# Patient Record
Sex: Female | Born: 1975 | Race: White | Hispanic: No | Marital: Married | Smoking: Never smoker
Health system: Southern US, Community
[De-identification: ages and names within clinical notes are randomized; demographics above are authoritative.]

---

## 2014-10-05 ENCOUNTER — Other Ambulatory Visit: Payer: Self-pay | Admitting: Obstetrics and Gynecology

## 2014-10-05 DIAGNOSIS — R928 Other abnormal and inconclusive findings on diagnostic imaging of breast: Secondary | ICD-10-CM

## 2014-10-12 ENCOUNTER — Ambulatory Visit
Admission: RE | Admit: 2014-10-12 | Discharge: 2014-10-12 | Disposition: A | Discharge status: Home / Self Care | Payer: BC Managed Care – PPO | Source: Ambulatory Visit | Attending: Obstetrics and Gynecology | Admitting: Obstetrics and Gynecology

## 2014-10-12 DIAGNOSIS — R928 Other abnormal and inconclusive findings on diagnostic imaging of breast: Secondary | ICD-10-CM

## 2016-03-17 ENCOUNTER — Encounter (HOSPITAL_COMMUNITY): Payer: Self-pay | Admitting: Emergency Medicine

## 2016-03-17 ENCOUNTER — Ambulatory Visit (HOSPITAL_COMMUNITY)
Admission: EM | Admit: 2016-03-17 | Discharge: 2016-03-17 | Disposition: A | Discharge status: Home / Self Care | Payer: BC Managed Care – PPO | Attending: Family Medicine | Admitting: Family Medicine

## 2016-03-17 DIAGNOSIS — R05 Cough: Secondary | ICD-10-CM

## 2016-03-17 DIAGNOSIS — J40 Bronchitis, not specified as acute or chronic: Secondary | ICD-10-CM

## 2016-03-17 DIAGNOSIS — R059 Cough, unspecified: Secondary | ICD-10-CM

## 2016-03-17 MED ORDER — METHYLPREDNISOLONE 4 MG PO TBPK: ORAL_TABLET | ORAL | 0 refills | Status: AC

## 2016-03-17 MED ORDER — AZITHROMYCIN 250 MG PO TABS: 250.0000 mg | ORAL_TABLET | Freq: Every day | ORAL | 0 refills | Status: AC

## 2016-03-17 NOTE — ED Triage Notes (Signed)
The patient presented to the [facility] with a complaint of a cough and chest congestion x 3 weeks.

## 2016-03-17 NOTE — ED Provider Notes (Signed)
CSN: [id number]     Arrival date & time 03/17/16  1637 History   None    Chief Complaint  Patient presents with  . Cough   (Consider location/radiation/quality/duration/timing/severity/associated sxs/prior Treatment) Patient c/o cough for 3 weeks.   The history is provided by the patient.  Cough  Cough characteristics:  Non-productive Sputum characteristics:  Nondescript Severity:  Moderate Onset quality:  Sudden Duration:  3 weeks Timing:  Constant Progression:  Unchanged Chronicity:  New Smoker: no   Relieved by:  Nothing Worsened by:  Nothing Ineffective treatments:  None tried   History reviewed. No pertinent past medical history. History reviewed. No pertinent surgical history. History reviewed. No pertinent family history. Social History  Substance Use Topics  . Smoking status: Never Smoker  . Smokeless tobacco: Never Used  . Alcohol use Yes     Comment: Occassional   OB History    No data available     Review of Systems  Constitutional: Negative.   HENT: Negative.   Eyes: Negative.   Respiratory: Positive for cough.   Cardiovascular: Negative.   Gastrointestinal: Negative.   Endocrine: Negative.   Genitourinary: Negative.   Musculoskeletal: Negative.   Skin: Negative.   Allergic/Immunologic: Negative.   Neurological: Negative.   Hematological: Negative.   Psychiatric/Behavioral: Negative.     Allergies  Review of patient's allergies indicates no known allergies.  Home Medications   Prior to Admission medications   Medication Sig Start Date End Date Taking? Authorizing Provider  azithromycin (ZITHROMAX) 250 MG tablet Take 1 tablet (250 mg total) by mouth daily. Take first 2 tablets together, then 1 every day until finished. 03/17/16   Deatra CanterWilliam J Jaegar Croft, FNP  methylPREDNISolone (MEDROL DOSEPAK) 4 MG TBPK tablet Take 6-5-4-3-2-1 po qd 03/17/16   Deatra CanterWilliam J Kaari Zeigler, FNP   Meds Ordered and Administered this Visit  Medications - No data to  display  BP 124/69 (BP Location: Left Arm)   Pulse 65   Temp 98.5 F (36.9 C) (Oral)   Resp 18   SpO2 98%  No data found.   Physical Exam  Constitutional: She appears well-developed and well-nourished.  HENT:  Head: Normocephalic and atraumatic.  Right Ear: External ear normal.  Left Ear: External ear normal.  Mouth/Throat: Oropharynx is clear and moist.  Eyes: Conjunctivae and EOM are normal. Pupils are equal, round, and reactive to light.  Neck: Normal range of motion. Neck supple.  Cardiovascular: Normal rate, regular rhythm and normal heart sounds.   Pulmonary/Chest: Effort normal. She has wheezes.  Abdominal: Soft. Bowel sounds are normal.  Nursing note and vitals reviewed.   Urgent Care Course   Clinical Course    Procedures (including critical care time)  Labs Review Labs Reviewed - No data to display  Imaging Review No results found.   Visual Acuity Review  Right Eye Distance:   Left Eye Distance:   Bilateral Distance:    Right Eye Near:   Left Eye Near:    Bilateral Near:         MDM   1. Bronchitis   2. Cough    zpak as directed Medrol dose pack as directed 4mg  #21 Continue otc cough and cold medicine.    Deatra CanterWilliam J Wei Poplaski, FNP 03/17/16 1742

## 2016-09-25 IMAGING — MG MM DIAG BREAST TOMO UNI LEFT
4 series · 4 of 12 positions shown · non-contrast
Comparison: Prior exams

CLINICAL DATA: Call back from screening for a possible left breast
mass.

EXAM:
DIGITAL DIAGNOSTIC LEFT MAMMOGRAM WITH 3D TOMOSYNTHESIS AND CAD

[L CC]
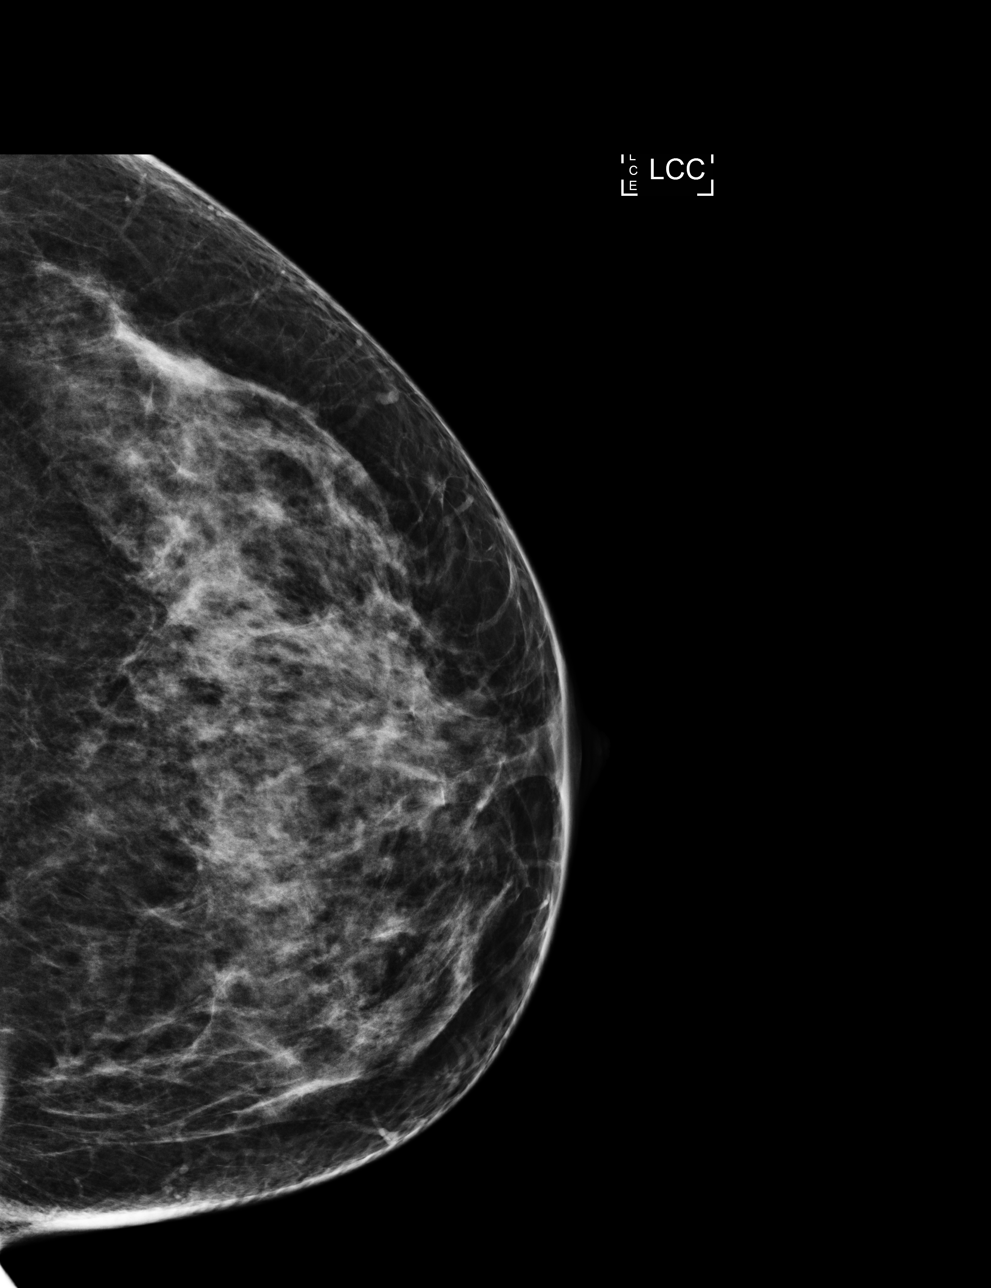

[L MLO]
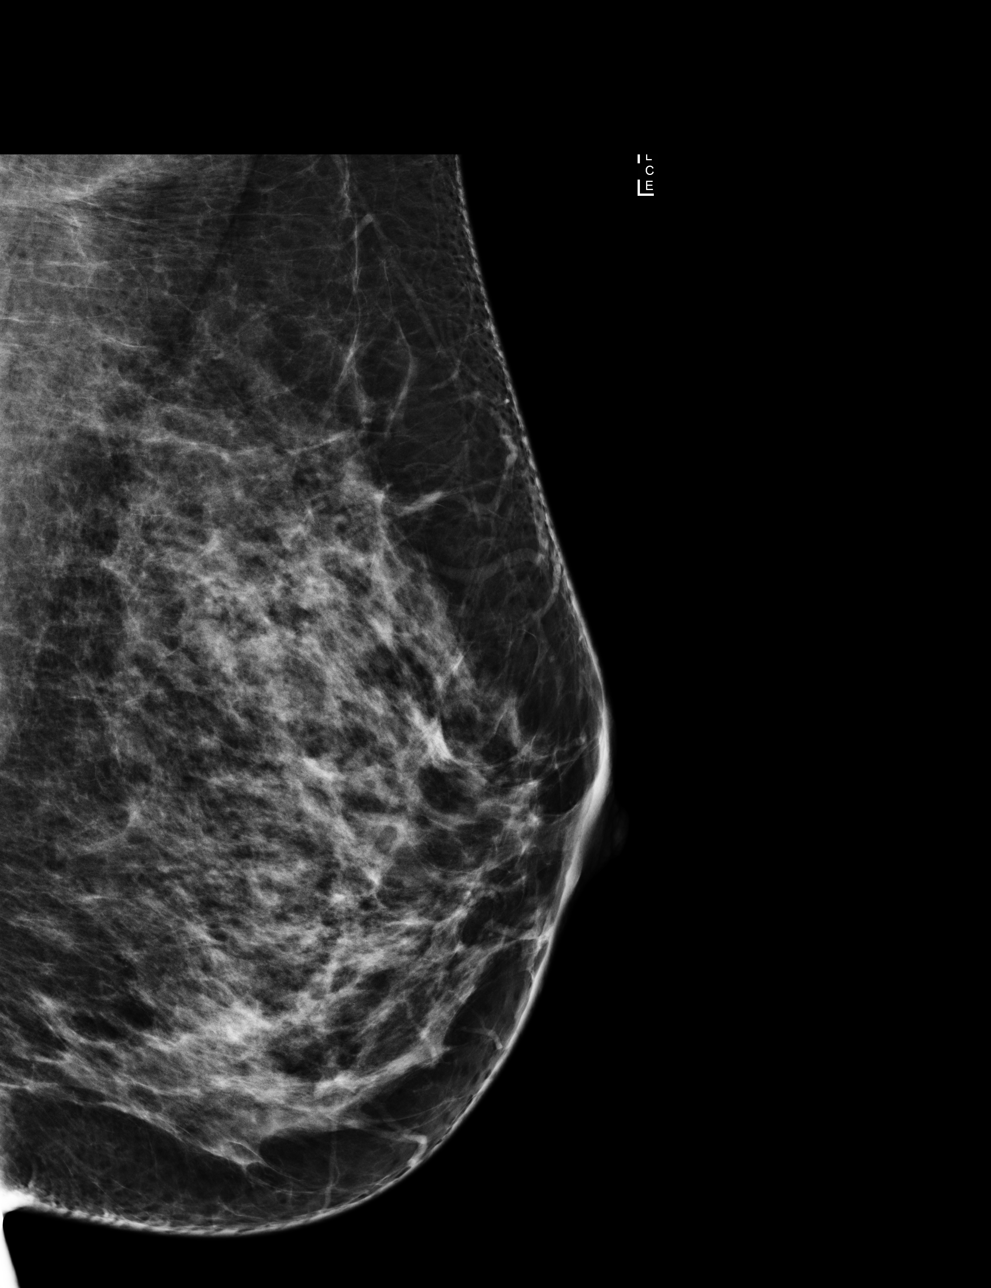

[L CC tomo · tomo slice 41/81.0]
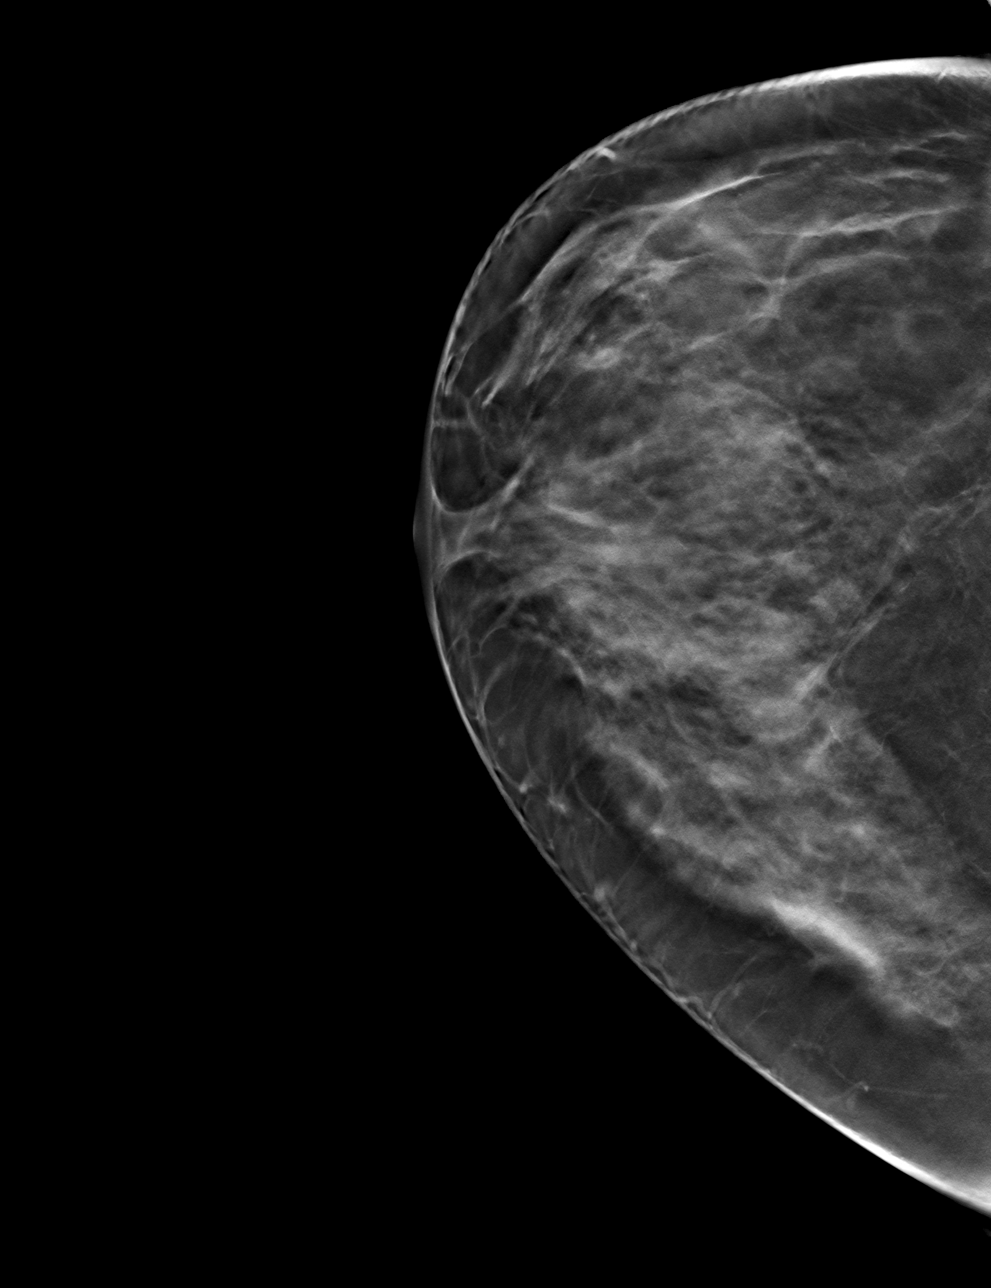

[L MLO tomo · tomo slice 41/81.0]
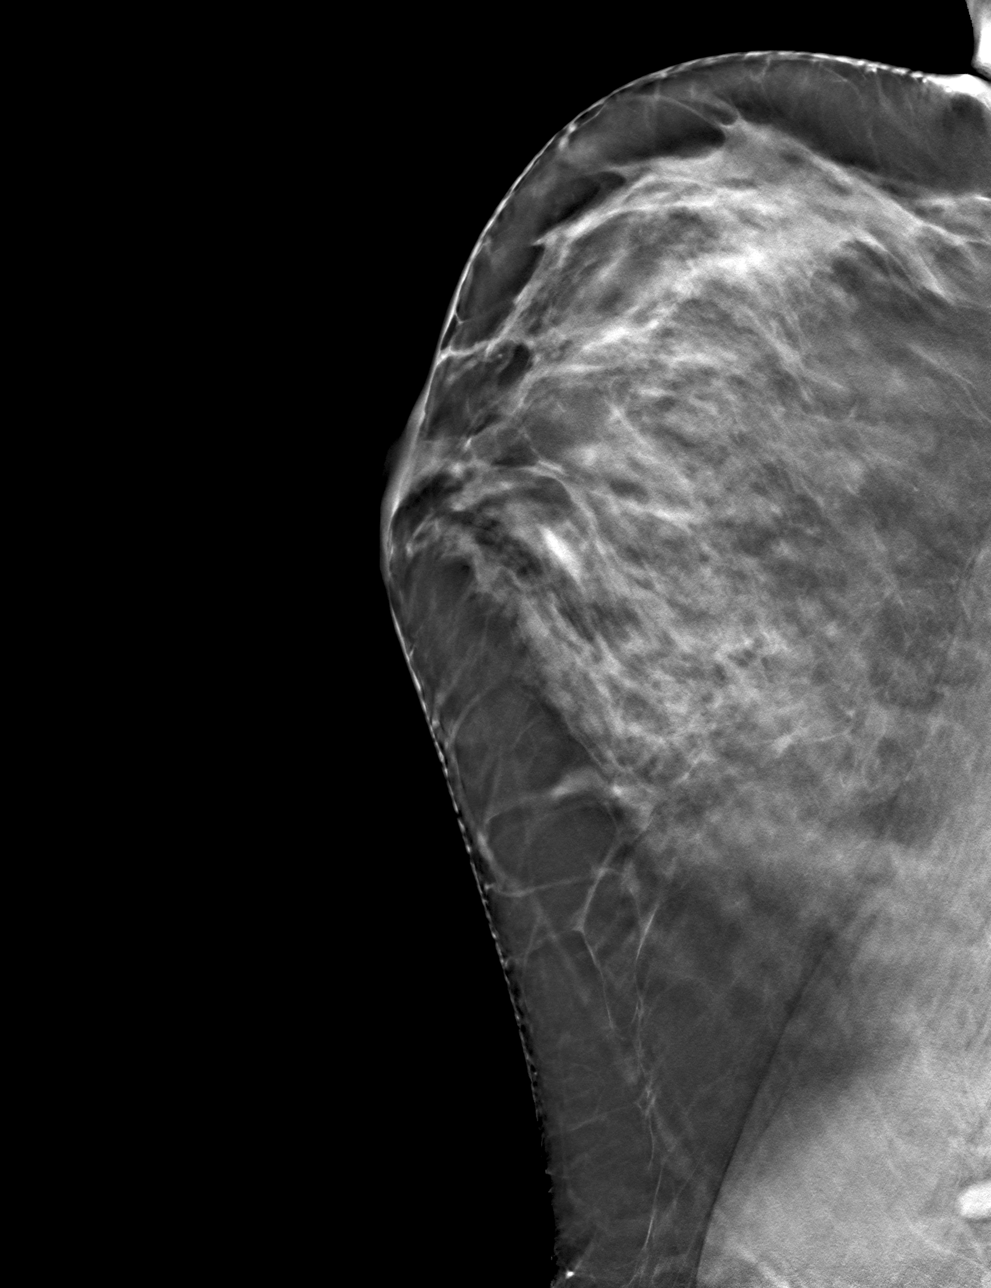

[4 of 12 positions shown; findings below may reference images not displayed]

ACR Breast Density Category c: The breast tissue is heterogeneously
dense, which may obscure small masses.
FINDINGS: The possible mass is not seen on the 3D imaging on either the CC or
the MLO views. There is no left breast mass or distortion. There are
no suspicious calcifications.

Mammographic images were processed with CAD.
IMPRESSION: No evidence of malignancy. Possible mass seen on screening was due
to superimposed fibroglandular tissue.

RECOMMENDATION:
Screening mammogram in one year.(Code:[id number])

I have discussed the findings and recommendations with the patient.
Results were also provided in writing at the conclusion of the
visit. If applicable, a reminder letter will be sent to the patient
regarding the next appointment.

BI-RADS CATEGORY  1: Negative.

## 2017-03-30 ENCOUNTER — Encounter (HOSPITAL_COMMUNITY): Payer: Self-pay | Admitting: *Deleted

## 2017-03-30 ENCOUNTER — Emergency Department (HOSPITAL_COMMUNITY)
Admission: EM | Admit: 2017-03-30 | Discharge: 2017-03-30 | Disposition: A | Discharge status: Home / Self Care | Payer: BC Managed Care – PPO | Attending: Emergency Medicine | Admitting: Emergency Medicine

## 2017-03-30 DIAGNOSIS — Z203 Contact with and (suspected) exposure to rabies: Secondary | ICD-10-CM | POA: Diagnosis present

## 2017-03-30 DIAGNOSIS — Z23 Encounter for immunization: Secondary | ICD-10-CM | POA: Diagnosis not present

## 2017-03-30 MED ORDER — RABIES VACCINE, PCEC IM SUSR
1.0000 mL | Freq: Once | INTRAMUSCULAR | Status: AC
  Administered 2017-03-30: 1 mL via INTRAMUSCULAR
  Filled 2017-03-30: qty 1

## 2017-03-30 MED ORDER — RABIES IMMUNE GLOBULIN 150 UNIT/ML IM INJ
20.0000 [IU]/kg | INJECTION | Freq: Once | INTRAMUSCULAR | Status: AC
  Administered 2017-03-30: 1725 [IU] via INTRAMUSCULAR
  Filled 2017-03-30: qty 11.5

## 2017-03-30 NOTE — Discharge Instructions (Signed)
Please return for 2nd vaccine on 9/14

## 2017-03-30 NOTE — ED Notes (Signed)
Dr. Arley Phenixeis and Tresa EndoKelly PA at bedside

## 2017-03-30 NOTE — ED Triage Notes (Signed)
Family woke up to a dead bat in their house last Wednesday.  They were unable to get it tested b/c when animal control came in on Saturday to get it, they said it was too late.  

## 2017-03-30 NOTE — ED Provider Notes (Signed)
[facility]-EMERGENCY DEPT Provider Note   CSN: [id number] Arrival date & time: 03/30/17  1738     History   Chief Complaint Chief Complaint  Patient presents with  . Rabies Injection    HPI Megan Park is a 41 y.o. female who presents with possible bat exposure. No significant PMH. She is accompanied by her husband and three children who present for the same problem. They found a dead bat in their children's room six days ago. No one directly touched the bat however the husband put a towel over it and put it in a bag. Animal control came to their home three days ago but advised them that they could not test the bat. They were advised to come to the ED for rabies series. No known bites or recent illness.  HPI  History reviewed. No pertinent past medical history.  There are no active problems to display for this patient.   History reviewed. No pertinent surgical history.  OB History    No data available       Home Medications    Prior to Admission medications   Medication Sig Start Date End Date Taking? Authorizing Provider  azithromycin (ZITHROMAX) 250 MG tablet Take 1 tablet (250 mg total) by mouth daily. Take first 2 tablets together, then 1 every day until finished. 03/17/16   Deatra Canterxford, William J, FNP  methylPREDNISolone (MEDROL DOSEPAK) 4 MG TBPK tablet Take 6-5-4-3-2-1 po qd 03/17/16   Deatra Canterxford, William J, FNP    Family History No family history on file.  Social History Social History  Substance Use Topics  . Smoking status: Never Smoker  . Smokeless tobacco: Never Used  . Alcohol use Yes     Comment: Occassional     Allergies   Patient has no known allergies.   Review of Systems Review of Systems  Constitutional: Negative for fever.       +bat exposure  Skin:       -bite     Physical Exam Updated Vital Signs BP 133/85 (BP Location: Right Arm)   Pulse 62   Temp 97.7 F (36.5 C) (Oral)   Resp 20   Wt 85 kg (187 lb 6.3 oz)   SpO2 100%   Physical  Exam  Constitutional: She is oriented to person, place, and time. She appears well-developed and well-nourished. No distress.  HENT:  Head: Normocephalic and atraumatic.  Eyes: Pupils are equal, round, and reactive to light. Conjunctivae are normal. Right eye exhibits no discharge. Left eye exhibits no discharge. No scleral icterus.  Neck: Normal range of motion.  Cardiovascular: Normal rate and regular rhythm.  Exam reveals no gallop and no friction rub.   No murmur heard. Pulmonary/Chest: Effort normal and breath sounds normal. No respiratory distress. She has no wheezes. She has no rales. She exhibits no tenderness.  Abdominal: She exhibits no distension.  Neurological: She is alert and oriented to person, place, and time.  Skin: Skin is warm and dry.  Psychiatric: She has a normal mood and affect. Her behavior is normal.  Nursing note and vitals reviewed.    ED Treatments / Results  Labs (all labs ordered are listed, but only abnormal results are displayed) Labs Reviewed - No data to display  EKG  EKG Interpretation None       Radiology No results found.  Procedures Procedures (including critical care time)  Medications Ordered in ED Medications  rabies vaccine (RABAVERT) injection 1 mL (1 mL Intramuscular Given 03/30/17 1934)  rabies  immune globulin (HYPERAB/KEDRAB) injection 1,725 Units (1,725 Units Intramuscular Given 03/30/17 1935)     Initial Impression / Assessment and Plan / ED Course  I have reviewed the triage vital signs and the nursing notes.  Pertinent labs & imaging results that were available during my care of the patient were reviewed by me and considered in my medical decision making (see chart for details).   41 year old female with possible bat exposure. Rabies and immunoglobulin given. Advised return on 9/14 for second vaccination in series.  Final Clinical Impressions(s) / ED Diagnoses   Final diagnoses:  Need for rabies vaccination    New  Prescriptions New Prescriptions   No medications on file     Beryle Quant 03/30/17 2100    Ree Shay, MD 03/31/17 1354

## 2017-04-02 ENCOUNTER — Ambulatory Visit (HOSPITAL_COMMUNITY)
Admission: EM | Admit: 2017-04-02 | Discharge: 2017-04-02 | Disposition: A | Discharge status: Home / Self Care | Payer: BC Managed Care – PPO | Attending: Internal Medicine | Admitting: Internal Medicine

## 2017-04-02 ENCOUNTER — Encounter (HOSPITAL_COMMUNITY): Payer: Self-pay | Admitting: Emergency Medicine

## 2017-04-02 DIAGNOSIS — Z203 Contact with and (suspected) exposure to rabies: Secondary | ICD-10-CM

## 2017-04-02 MED ORDER — RABIES VACCINE, PCEC IM SUSR
1.0000 mL | Freq: Once | INTRAMUSCULAR | Status: AC
  Administered 2017-04-02: 1 mL via INTRAMUSCULAR

## 2017-04-02 MED ORDER — RABIES VACCINE, PCEC IM SUSR
INTRAMUSCULAR | Status: AC
  Filled 2017-04-02: qty 1

## 2017-04-02 NOTE — ED Triage Notes (Signed)
Pt here for day 3 rabies vaccination... sts last rabies vaccination was on the right arm  Voices no new concerns... A&O x4... NAD.... Ambulatory

## 2017-04-02 NOTE — Discharge Instructions (Signed)
Return on 9/18 for day 7 rabies vaccination

## 2017-04-06 ENCOUNTER — Ambulatory Visit (HOSPITAL_COMMUNITY)
Admission: EM | Admit: 2017-04-06 | Discharge: 2017-04-06 | Disposition: A | Discharge status: Home / Self Care | Payer: BC Managed Care – PPO | Attending: Internal Medicine | Admitting: Internal Medicine

## 2017-04-06 ENCOUNTER — Encounter (HOSPITAL_COMMUNITY): Payer: Self-pay | Admitting: Emergency Medicine

## 2017-04-06 DIAGNOSIS — Z203 Contact with and (suspected) exposure to rabies: Secondary | ICD-10-CM

## 2017-04-06 MED ORDER — RABIES VACCINE, PCEC IM SUSR
INTRAMUSCULAR | Status: AC
  Filled 2017-04-06: qty 1

## 2017-04-06 MED ORDER — RABIES VACCINE, PCEC IM SUSR
1.0000 mL | Freq: Once | INTRAMUSCULAR | Status: AC
  Administered 2017-04-06: 1 mL via INTRAMUSCULAR

## 2017-04-06 NOTE — ED Triage Notes (Signed)
Pt here for rabies inj day 7... Voices no new concerns... A&O x4... NAD... Ambulatory  

## 2017-04-06 NOTE — Discharge Instructions (Signed)
Return on 9/25 for day 14 of rabies °

## 2017-04-13 ENCOUNTER — Ambulatory Visit (HOSPITAL_COMMUNITY)
Admission: EM | Admit: 2017-04-13 | Discharge: 2017-04-13 | Disposition: A | Discharge status: Home / Self Care | Payer: BC Managed Care – PPO | Attending: Internal Medicine | Admitting: Internal Medicine

## 2017-04-13 DIAGNOSIS — Z203 Contact with and (suspected) exposure to rabies: Secondary | ICD-10-CM | POA: Diagnosis not present

## 2017-04-13 MED ORDER — RABIES VACCINE, PCEC IM SUSR
1.0000 mL | Freq: Once | INTRAMUSCULAR | Status: AC
  Administered 2017-04-13: 1 mL via INTRAMUSCULAR

## 2017-04-13 MED ORDER — RABIES VACCINE, PCEC IM SUSR
INTRAMUSCULAR | Status: AC
  Filled 2017-04-13: qty 1

## 2017-04-13 NOTE — ED Triage Notes (Signed)
Pt here for day 14 rabies vaccine.  No problems reported.  

## 2019-07-12 ENCOUNTER — Ambulatory Visit: Payer: BC Managed Care – PPO | Attending: Internal Medicine

## 2019-07-12 DIAGNOSIS — U071 COVID-19: Secondary | ICD-10-CM

## 2019-07-14 LAB — NOVEL CORONAVIRUS, NAA: SARS-CoV-2, NAA: DETECTED — AB

## 2020-07-25 ENCOUNTER — Other Ambulatory Visit: Payer: Self-pay

## 2020-07-25 DIAGNOSIS — Z20822 Contact with and (suspected) exposure to covid-19: Secondary | ICD-10-CM

## 2020-07-27 LAB — SARS-COV-2, NAA 2 DAY TAT

## 2020-07-27 LAB — NOVEL CORONAVIRUS, NAA: SARS-CoV-2, NAA: NOT DETECTED

## 2020-07-27 LAB — SPECIMEN STATUS REPORT

## 2020-10-03 ENCOUNTER — Other Ambulatory Visit: Payer: Self-pay | Admitting: Sports Medicine

## 2020-10-03 DIAGNOSIS — M542 Cervicalgia: Secondary | ICD-10-CM

## 2020-10-09 ENCOUNTER — Other Ambulatory Visit: Payer: Self-pay

## 2020-10-09 ENCOUNTER — Ambulatory Visit
Admission: RE | Admit: 2020-10-09 | Discharge: 2020-10-09 | Disposition: A | Discharge status: Home / Self Care | Payer: BC Managed Care – PPO | Source: Ambulatory Visit | Attending: Sports Medicine | Admitting: Sports Medicine

## 2020-10-09 DIAGNOSIS — M542 Cervicalgia: Secondary | ICD-10-CM

## 2020-10-09 MED ORDER — IOPAMIDOL (ISOVUE-M 300) INJECTION 61%
1.0000 mL | Freq: Once | INTRAMUSCULAR | Status: AC
  Administered 2020-10-09: 1 mL via EPIDURAL

## 2020-10-09 MED ORDER — TRIAMCINOLONE ACETONIDE 40 MG/ML IJ SUSP (RADIOLOGY)
60.0000 mg | Freq: Once | INTRAMUSCULAR | Status: AC
  Administered 2020-10-09: 60 mg via EPIDURAL

## 2020-10-09 NOTE — Discharge Instructions (Signed)
